# Patient Record
Sex: Female | Born: 1945 | Race: White | Hispanic: No | Marital: Single | Smoking: Never smoker
Health system: Southern US, Community
[De-identification: ages and names within clinical notes are randomized; demographics above are authoritative.]

## PROBLEM LIST (undated history)

## (undated) DIAGNOSIS — I10 Essential (primary) hypertension: Secondary | ICD-10-CM

## (undated) DIAGNOSIS — R569 Unspecified convulsions: Secondary | ICD-10-CM

---

## 2012-01-22 ENCOUNTER — Emergency Department (HOSPITAL_COMMUNITY): Payer: Medicare Other

## 2012-01-22 ENCOUNTER — Other Ambulatory Visit: Payer: Self-pay

## 2012-01-22 ENCOUNTER — Encounter (HOSPITAL_COMMUNITY): Payer: Self-pay | Admitting: Emergency Medicine

## 2012-01-22 ENCOUNTER — Emergency Department (HOSPITAL_COMMUNITY)
Admission: EM | Admit: 2012-01-22 | Discharge: 2012-01-22 | Disposition: A | Discharge status: Home / Self Care | Payer: Medicare Other | Attending: Emergency Medicine | Admitting: Emergency Medicine

## 2012-01-22 DIAGNOSIS — I10 Essential (primary) hypertension: Secondary | ICD-10-CM | POA: Insufficient documentation

## 2012-01-22 DIAGNOSIS — Z79899 Other long term (current) drug therapy: Secondary | ICD-10-CM | POA: Insufficient documentation

## 2012-01-22 DIAGNOSIS — R079 Chest pain, unspecified: Secondary | ICD-10-CM | POA: Insufficient documentation

## 2012-01-22 HISTORY — DX: Unspecified convulsions: R56.9

## 2012-01-22 HISTORY — DX: Essential (primary) hypertension: I10

## 2012-01-22 LAB — POCT I-STAT, CHEM 8
Calcium, Ion: 1.23 mmol/L (ref 1.12–1.32)
Chloride: 113 mEq/L — ABNORMAL HIGH (ref 96–112)
Creatinine, Ser: 0.8 mg/dL (ref 0.50–1.10)
Glucose, Bld: 106 mg/dL — ABNORMAL HIGH (ref 70–99)
HCT: 44 % (ref 36.0–46.0)
Potassium: 3.7 mEq/L (ref 3.5–5.1)

## 2012-01-22 LAB — CBC
HCT: 42.6 % (ref 36.0–46.0)
Hemoglobin: 14.4 g/dL (ref 12.0–15.0)
MCHC: 33.8 g/dL (ref 30.0–36.0)
MCV: 93.4 fL (ref 78.0–100.0)
RDW: 13.2 % (ref 11.5–15.5)
WBC: 9 10*3/uL (ref 4.0–10.5)

## 2012-01-22 LAB — DIFFERENTIAL
Basophils Absolute: 0 10*3/uL (ref 0.0–0.1)
Eosinophils Relative: 4 % (ref 0–5)
Lymphocytes Relative: 28 % (ref 12–46)
Monocytes Absolute: 0.6 10*3/uL (ref 0.1–1.0)
Monocytes Relative: 7 % (ref 3–12)
Neutro Abs: 5.5 10*3/uL (ref 1.7–7.7)

## 2012-01-22 NOTE — ED Notes (Signed)
Pt pain free. Remains sr on the monitor. Family at bs. Skin warm and  Dry,resp even and unlabored.

## 2012-01-22 NOTE — ED Notes (Signed)
Pt with intermittent chest pain for the last 3 months. Today worse than normal. Pain 6/10. Upon EMS arrival, denied pain. 20g L hand.

## 2012-01-22 NOTE — ED Provider Notes (Signed)
History     CSN: [id number]  Arrival date & time 01/22/12  1531   First MD Initiated Contact with Patient 01/22/12 1537      Chief Complaint  Patient presents with  . Chest Pain    (Consider location/radiation/quality/duration/timing/severity/associated sxs/prior treatment) Patient is a 66 y.o. female presenting with chest pain. The history is provided by the patient.  Chest Pain Pertinent negatives for primary symptoms include no shortness of breath, no abdominal pain, no nausea and no vomiting.  Pertinent negatives for associated symptoms include no numbness and no weakness.    patient has had episodes of chest pain for last 3 months. The common on half. She has some 3 times a week she said. Should episode today that lasted longer and was more severe than the other ones. So his left chest pressure. This woman upper neck lower also. No dyspnea. No cough. No fevers. The pain does not come on with exertion. The patient states she's not exert herself much though. She does not smoke. She does not have an early history of cardiac disease in her family. No recent travel. No leg swelling. She is pain-free now  Past Medical History  Diagnosis Date  . Hypertension   . Seizures     History reviewed. No pertinent past surgical history.  History reviewed. No pertinent family history.  History  Substance Use Topics  . Smoking status: Not on file  . Smokeless tobacco: Not on file  . Alcohol Use:     OB History    Grav Para Term Preterm Abortions TAB SAB Ect Mult Living                  Review of Systems  Constitutional: Negative for activity change and appetite change.  HENT: Negative for neck stiffness.   Eyes: Negative for pain.  Respiratory: Negative for chest tightness and shortness of breath.   Cardiovascular: Positive for chest pain. Negative for leg swelling.  Gastrointestinal: Negative for nausea, vomiting, abdominal pain and diarrhea.  Genitourinary: Negative for flank  pain.  Musculoskeletal: Negative for back pain.  Skin: Negative for rash.  Neurological: Negative for weakness, numbness and headaches.  Psychiatric/Behavioral: Negative for behavioral problems.    Allergies  Penicillins  Home Medications   Current Outpatient Rx  Name Route Sig Dispense Refill  . ATORVASTATIN CALCIUM 80 MG PO TABS Oral Take 80 mg by mouth daily.    . DULOXETINE HCL 30 MG PO CPEP Oral Take 30 mg by mouth daily.    . MELOXICAM 15 MG PO TABS Oral Take 15 mg by mouth daily.    . SERTRALINE HCL 100 MG PO TABS Oral Take 100 mg by mouth daily.    . TOPIRAMATE 50 MG PO TABS Oral Take 50-75 mg by mouth 2 (two) times daily. 1 tablet in the morning and 1.5 tablets at bedtime.    Marland Kitchen VITAMIN D (ERGOCALCIFEROL) 50000 UNITS PO CAPS Oral Take 50,000 Units by mouth every 7 (seven) days. Saturdays.      BP 126/59  Pulse 72  Temp(Src) 97.1 F (36.2 C) (Oral)  Resp 20  SpO2 96%  Physical Exam  Nursing note and vitals reviewed. Constitutional: She is oriented to person, place, and time. She appears well-developed and well-nourished.  HENT:  Head: Normocephalic and atraumatic.  Eyes: EOM are normal. Pupils are equal, round, and reactive to light.  Neck: Normal range of motion. Neck supple.  Cardiovascular: Normal rate, regular rhythm and normal heart sounds.  No murmur heard. Pulmonary/Chest: Effort normal and breath sounds normal. No respiratory distress. She has no wheezes. She has no rales.  Abdominal: Soft. Bowel sounds are normal. She exhibits no distension. There is no tenderness. There is no rebound and no guarding.  Musculoskeletal: Normal range of motion.  Neurological: She is alert and oriented to person, place, and time. No cranial nerve deficit.  Skin: Skin is warm and dry.  Psychiatric: She has a normal mood and affect. Her speech is normal.    ED Course  Procedures (including critical care time)  Labs Reviewed  POCT I-STAT, CHEM 8 - Abnormal; Notable for the  following:    Chloride 113 (*)    Glucose, Bld 106 (*)    All other components within normal limits  CBC  DIFFERENTIAL  TROPONIN I   Dg Chest 2 View  01/22/2012  *RADIOLOGY REPORT*  Clinical Data: Chest pain.  CHEST - 2 VIEW  Comparison: None.  Findings: Trachea is midline.  Heart size normal.  Minimal scarring at the lung bases, left greater than right.  Lungs are otherwise clear.  No pleural fluid.  IMPRESSION: No acute findings.  Original Report Authenticated By: Reyes Ivan, M.D.     1. Chest pain      Date: 01/22/2012  Rate: 82  Rhythm: normal sinus rhythm  QRS Axis: normal  Intervals: normal  ST/T Wave abnormalities: normal  Conduction Disutrbances:none  Narrative Interpretation:   Old EKG Reviewed: none available    MDM  Chest pain for last 3 months. Pain on and off. EKG is normal. Enzymes are negative. The pain is not with exertion. She'll followup with her primary care Dr. Melynda Keller will call [facility] heart and vascular tomorrow      I will  Juliet Rude. Rubin Payor, MD 01/22/12 [phone]

## 2013-02-24 IMAGING — CR DG CHEST 2V
2 series · 2 of 2 positions shown · non-contrast
Comparison: None.

CLINICAL DATA: Chest pain.

CHEST - 2 VIEW

[w chest pa]
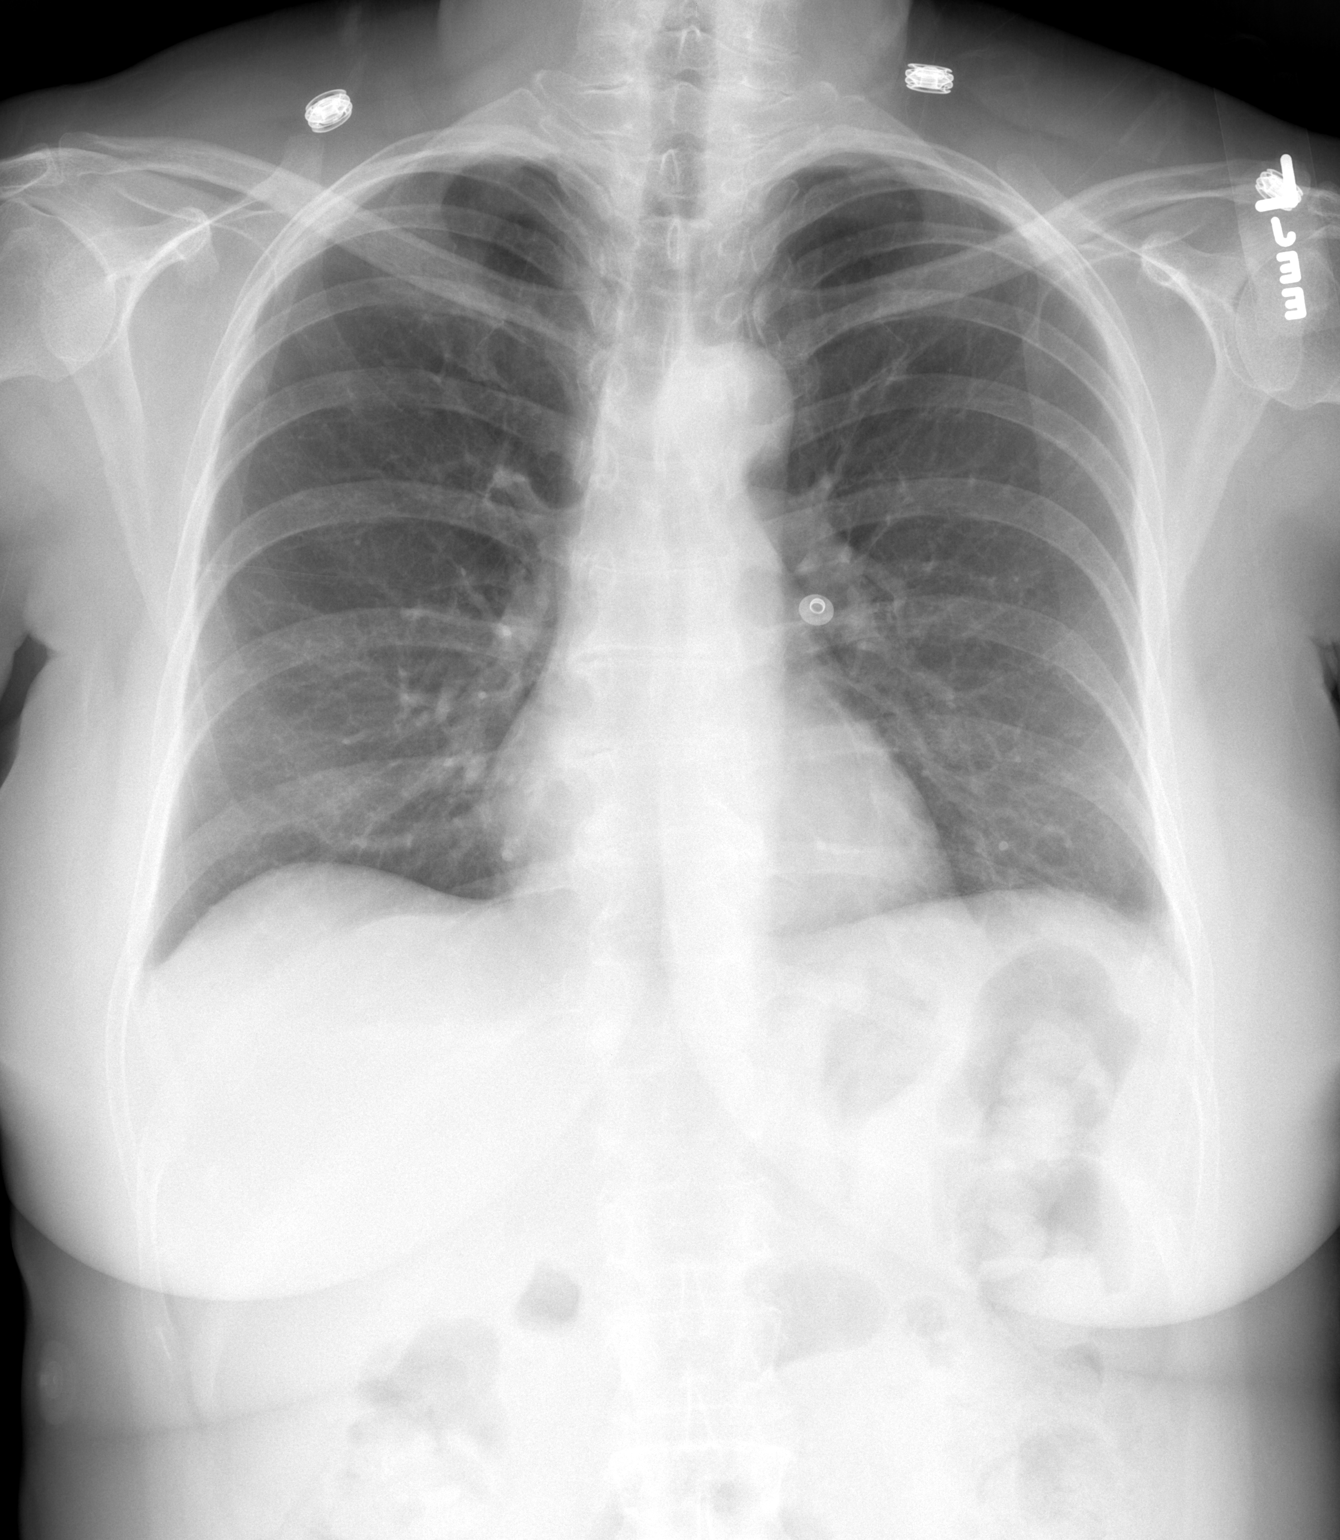

[w chest lat]
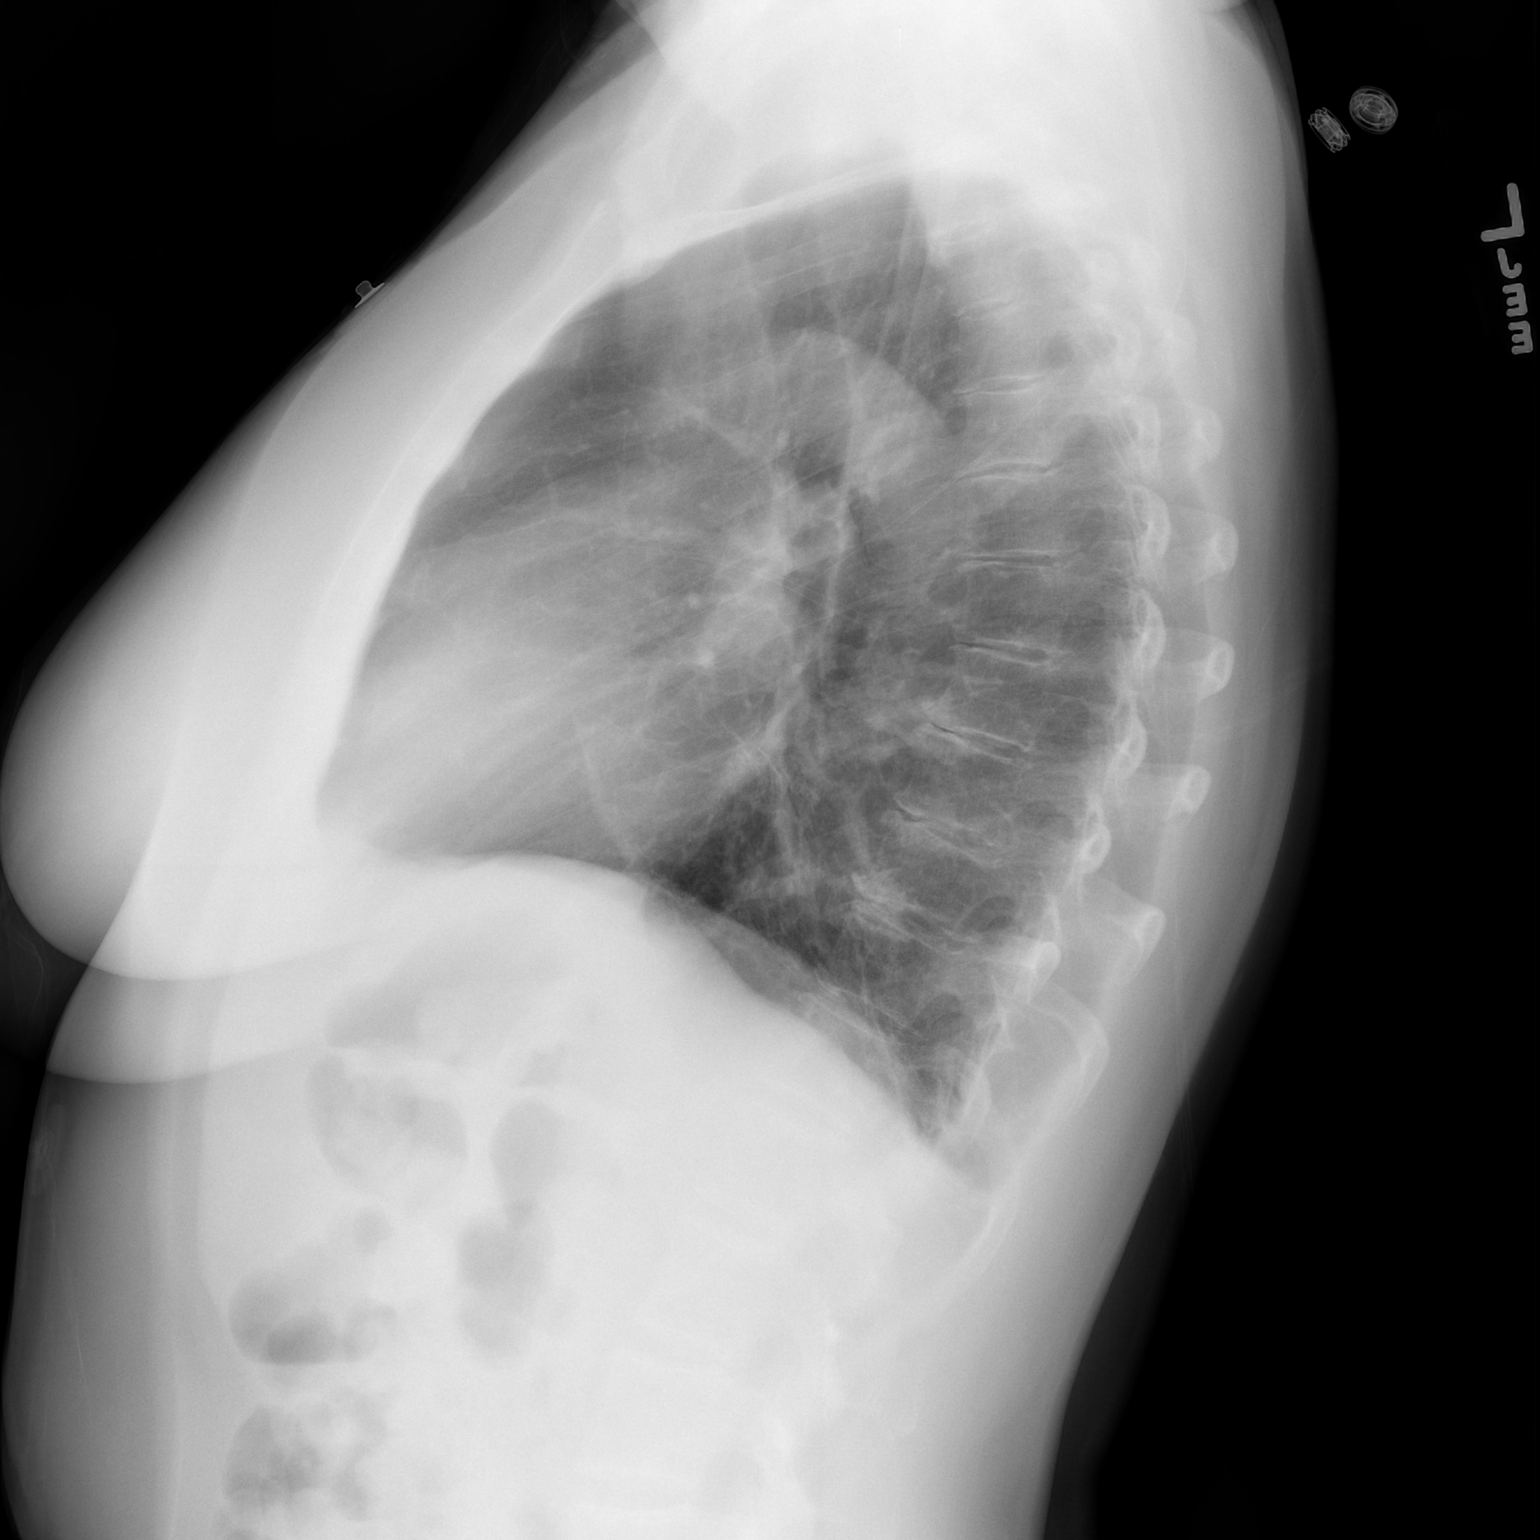

[2 of 2 positions shown; findings below may reference images not displayed]

FINDINGS: Trachea is midline.  Heart size normal.  Minimal scarring
at the lung bases, left greater than right.  Lungs are otherwise
clear.  No pleural fluid.
IMPRESSION: No acute findings.

## 2014-09-29 ENCOUNTER — Encounter (HOSPITAL_BASED_OUTPATIENT_CLINIC_OR_DEPARTMENT_OTHER): Payer: Self-pay | Admitting: Emergency Medicine

## 2014-09-29 ENCOUNTER — Emergency Department (HOSPITAL_BASED_OUTPATIENT_CLINIC_OR_DEPARTMENT_OTHER)
Admission: EM | Admit: 2014-09-29 | Discharge: 2014-09-29 | Disposition: A | Discharge status: Other Acute Inpatient Hospital | Payer: Medicare Other | Attending: Emergency Medicine | Admitting: Emergency Medicine

## 2014-09-29 ENCOUNTER — Emergency Department (HOSPITAL_BASED_OUTPATIENT_CLINIC_OR_DEPARTMENT_OTHER): Payer: Medicare Other

## 2014-09-29 DIAGNOSIS — R410 Disorientation, unspecified: Secondary | ICD-10-CM | POA: Diagnosis not present

## 2014-09-29 DIAGNOSIS — G43909 Migraine, unspecified, not intractable, without status migrainosus: Secondary | ICD-10-CM | POA: Insufficient documentation

## 2014-09-29 DIAGNOSIS — G40909 Epilepsy, unspecified, not intractable, without status epilepticus: Secondary | ICD-10-CM | POA: Insufficient documentation

## 2014-09-29 DIAGNOSIS — I10 Essential (primary) hypertension: Secondary | ICD-10-CM | POA: Diagnosis not present

## 2014-09-29 DIAGNOSIS — Z791 Long term (current) use of non-steroidal anti-inflammatories (NSAID): Secondary | ICD-10-CM | POA: Diagnosis not present

## 2014-09-29 DIAGNOSIS — R4182 Altered mental status, unspecified: Secondary | ICD-10-CM

## 2014-09-29 DIAGNOSIS — Z880 Allergy status to penicillin: Secondary | ICD-10-CM | POA: Diagnosis not present

## 2014-09-29 DIAGNOSIS — Z79899 Other long term (current) drug therapy: Secondary | ICD-10-CM | POA: Insufficient documentation

## 2014-09-29 LAB — URINALYSIS, ROUTINE W REFLEX MICROSCOPIC
BILIRUBIN URINE: NEGATIVE
GLUCOSE, UA: NEGATIVE mg/dL
Hgb urine dipstick: NEGATIVE
KETONES UR: NEGATIVE mg/dL
Nitrite: NEGATIVE
PH: 7.5 (ref 5.0–8.0)
Protein, ur: NEGATIVE mg/dL
SPECIFIC GRAVITY, URINE: 1.021 (ref 1.005–1.030)
Urobilinogen, UA: 1 mg/dL (ref 0.0–1.0)

## 2014-09-29 LAB — COMPREHENSIVE METABOLIC PANEL
ALK PHOS: 84 U/L (ref 39–117)
ALT: 30 U/L (ref 0–35)
AST: 32 U/L (ref 0–37)
Albumin: 4.3 g/dL (ref 3.5–5.2)
Anion gap: 15 (ref 5–15)
BUN: 21 mg/dL (ref 6–23)
CO2: 22 meq/L (ref 19–32)
Calcium: 9.3 mg/dL (ref 8.4–10.5)
Chloride: 106 mEq/L (ref 96–112)
Creatinine, Ser: 0.9 mg/dL (ref 0.50–1.10)
GFR, EST AFRICAN AMERICAN: 74 mL/min — AB (ref >90)
GFR, EST NON AFRICAN AMERICAN: 64 mL/min — AB (ref >90)
GLUCOSE: 191 mg/dL — AB (ref 70–99)
POTASSIUM: 4.4 meq/L (ref 3.7–5.3)
SODIUM: 143 meq/L (ref 137–147)
Total Bilirubin: 0.4 mg/dL (ref 0.3–1.2)
Total Protein: 7.8 g/dL (ref 6.0–8.3)

## 2014-09-29 LAB — CBC WITH DIFFERENTIAL/PLATELET
Basophils Absolute: 0 10*3/uL (ref 0.0–0.1)
Basophils Relative: 0 % (ref 0–1)
Eosinophils Absolute: 0.3 10*3/uL (ref 0.0–0.7)
Eosinophils Relative: 4 % (ref 0–5)
HCT: 46 % (ref 36.0–46.0)
Hemoglobin: 15.4 g/dL — ABNORMAL HIGH (ref 12.0–15.0)
LYMPHS ABS: 1.6 10*3/uL (ref 0.7–4.0)
LYMPHS PCT: 22 % (ref 12–46)
MCH: 32.3 pg (ref 26.0–34.0)
MCHC: 33.5 g/dL (ref 30.0–36.0)
MCV: 96.4 fL (ref 78.0–100.0)
Monocytes Absolute: 0.4 10*3/uL (ref 0.1–1.0)
Monocytes Relative: 6 % (ref 3–12)
NEUTROS ABS: 4.8 10*3/uL (ref 1.7–7.7)
NEUTROS PCT: 68 % (ref 43–77)
PLATELETS: 209 10*3/uL (ref 150–400)
RBC: 4.77 MIL/uL (ref 3.87–5.11)
RDW: 13.2 % (ref 11.5–15.5)
WBC: 7.1 10*3/uL (ref 4.0–10.5)

## 2014-09-29 LAB — CSF CELL COUNT WITH DIFFERENTIAL
COLOR CSF: 1 — AB
RBC Count, CSF: 2100 /mm3 — ABNORMAL HIGH
RBC Count, CSF: 8 /mm3 — ABNORMAL HIGH
TUBE #: 1
Tube #: 4
WBC, CSF: 0 /mm3 (ref 0–5)
WBC, CSF: 1 /mm3 (ref 0–5)

## 2014-09-29 LAB — CBG MONITORING, ED: Glucose-Capillary: 188 mg/dL — ABNORMAL HIGH (ref 70–99)

## 2014-09-29 LAB — HERPES SIMPLEX VIRUS(HSV) DNA BY PCR
HSV 1 DNA: NOT DETECTED
HSV 2 DNA: NOT DETECTED

## 2014-09-29 LAB — URINE MICROSCOPIC-ADD ON

## 2014-09-29 LAB — PROTIME-INR
INR: 1.03 (ref 0.00–1.49)
Prothrombin Time: 13.5 seconds (ref 11.6–15.2)

## 2014-09-29 LAB — PROTEIN, CSF: TOTAL PROTEIN, CSF: 66 mg/dL — AB (ref 15–45)

## 2014-09-29 LAB — GLUCOSE, CSF: Glucose, CSF: 92 mg/dL — ABNORMAL HIGH (ref 43–76)

## 2014-09-29 MED ORDER — METOCLOPRAMIDE HCL 5 MG/ML IJ SOLN
10.0000 mg | Freq: Once | INTRAMUSCULAR | Status: AC
  Administered 2014-09-29: 10 mg via INTRAVENOUS
  Filled 2014-09-29: qty 2

## 2014-09-29 MED ORDER — DIPHENHYDRAMINE HCL 50 MG/ML IJ SOLN
12.5000 mg | Freq: Once | INTRAMUSCULAR | Status: AC
  Administered 2014-09-29: 12.5 mg via INTRAVENOUS
  Filled 2014-09-29: qty 1

## 2014-09-29 MED ORDER — ACYCLOVIR SODIUM 50 MG/ML IV SOLN
INTRAVENOUS | Status: AC
  Administered 2014-09-29: 450 mg via INTRAVENOUS
  Filled 2014-09-29: qty 10

## 2014-09-29 MED ORDER — SODIUM CHLORIDE 0.9 % IV SOLN
Freq: Once | INTRAVENOUS | Status: AC
  Administered 2014-09-29: 10:00:00 via INTRAVENOUS

## 2014-09-29 MED ORDER — DEXTROSE 5 % IV SOLN: 450.0000 mg | Freq: Three times a day (TID) | INTRAVENOUS | Status: DC

## 2014-09-29 NOTE — ED Notes (Signed)
Pt ambulated to and from restroom with a quick and steady unassisted gait.

## 2014-09-29 NOTE — ED Provider Notes (Signed)
CSN: [id number]     Arrival date & time 09/29/14  0905 History   First MD Initiated Contact with Patient 09/29/14 [phone]     Chief Complaint  Patient presents with  . Altered Mental Status     (Consider location/radiation/quality/duration/timing/severity/associated sxs/prior Treatment) Patient is a [age] y.o. female presenting with altered mental status. The history is provided by the patient.  Altered Mental Status Presenting symptoms: behavior changes and confusion   Presenting symptoms: no disorientation   Severity:  Moderate Most recent episode:  Today Episode history:  Single Timing:  Constant Progression:  Worsening Chronicity:  New Context: not alcohol use, not dementia and not a recent illness   Associated symptoms: no abdominal pain, no fever and no vomiting     Past Medical History  Diagnosis Date  . Hypertension   . Seizures    History reviewed. No pertinent past surgical history. No family history on file. History  Substance Use Topics  . Smoking status: Never Smoker   . Smokeless tobacco: Not on file  . Alcohol Use: No   OB History   Grav Para Term Preterm Abortions TAB SAB Ect Mult Living                 Review of Systems  Constitutional: Negative for fever.  Respiratory: Negative for cough and shortness of breath.   Gastrointestinal: Negative for vomiting and abdominal pain.  Psychiatric/Behavioral: Positive for confusion.  All other systems reviewed and are negative.     Allergies  Penicillins and Dilantin  Home Medications   Prior to Admission medications   Medication Sig Start Date End Date Taking? Authorizing Provider  atorvastatin (LIPITOR) 80 MG tablet Take 80 mg by mouth daily.    Historical Provider, MD  DULoxetine (CYMBALTA) 30 MG capsule Take 30 mg by mouth daily.    Historical Provider, MD  meloxicam (MOBIC) 15 MG tablet Take 15 mg by mouth daily.    Historical Provider, MD  sertraline (ZOLOFT) 100 MG tablet Take 100 mg by mouth  daily.    Historical Provider, MD  topiramate (TOPAMAX) 50 MG tablet Take 50-75 mg by mouth 2 (two) times daily. 1 tablet in the morning and 1.5 tablets at bedtime.    Historical Provider, MD  Vitamin D, Ergocalciferol, (DRISDOL) 50000 UNITS CAPS Take 50,000 Units by mouth every 7 (seven) days. Saturdays.    Historical Provider, MD   BP 173/69  Pulse 82  Temp(Src) 98.2 F (36.8 C) (Oral)  Resp 16  Ht 4\' 11"  (1.499 m)  Wt 135 lb (61.236 kg)  BMI 27.25 kg/m2  SpO2 100% Physical Exam  Nursing note and vitals reviewed. Constitutional: She is oriented to person, place, and time. She appears well-developed and well-nourished. No distress.  HENT:  Head: Normocephalic and atraumatic.  Mouth/Throat: Oropharynx is clear and moist.  Eyes: EOM are normal. Pupils are equal, round, and reactive to light.  Neck: Normal range of motion. Neck supple.  Cardiovascular: Normal rate and regular rhythm.  Exam reveals no friction rub.   No murmur heard. Pulmonary/Chest: Effort normal and breath sounds normal. No respiratory distress. She has no wheezes. She has no rales.  Abdominal: Soft. She exhibits no distension. There is no tenderness. There is no rebound.  Musculoskeletal: Normal range of motion. She exhibits no edema.  Neurological: She is alert and oriented to person, place, and time. No cranial nerve deficit. She exhibits normal muscle tone. Coordination normal.  Answers questions slowly.   Skin: No rash noted.  She is not diaphoretic.    ED Course  LUMBAR PUNCTURE Date/Time: 09/29/2014 12:21 PM Performed by: Elwin Mocha Authorized by: Elwin Mocha Consent: Verbal consent obtained. Time out: Immediately prior to procedure a "time out" was called to verify the correct patient, procedure, equipment, support staff and site/side marked as required. Indications: evaluation for altered mental status Anesthesia: local infiltration Local anesthetic: lidocaine 1% without epinephrine Anesthetic  total: 5 ml Patient sedated: no Preparation: Patient was prepped and draped in the usual sterile fashion. Lumbar space: L4-L5 interspace Patient's position: sitting Needle gauge: 22 Needle type: spinal needle - Quincke tip Needle length: 3.5 in Number of attempts: 3 Fluid appearance: blood-tinged then clearing Tubes of fluid: 4 Total volume: 4 ml Post-procedure: site cleaned Patient tolerance: Patient tolerated the procedure well with no immediate complications.   (including critical care time) Labs Review Labs Reviewed  CBC WITH DIFFERENTIAL - Abnormal; Notable for the following:    Hemoglobin 15.4 (*)    All other components within normal limits  COMPREHENSIVE METABOLIC PANEL  PROTIME-INR  URINALYSIS, ROUTINE W REFLEX MICROSCOPIC  CBG MONITORING, ED    Imaging Review Dg Chest 2 View  09/29/2014   CLINICAL DATA:  Altered mental status, headache  EXAM: CHEST  2 VIEW  COMPARISON:  01/22/2012  FINDINGS: The heart size and mediastinal contours are within normal limits. Both lungs are clear. The visualized skeletal structures are unremarkable.  IMPRESSION: No active cardiopulmonary disease.   Electronically Signed   By: Elige Ko   On: 09/29/2014 10:08   Ct Head Wo Contrast  09/29/2014   CLINICAL DATA:  Headache since this morning, denies any injury  EXAM: CT HEAD WITHOUT CONTRAST  TECHNIQUE: Contiguous axial images were obtained from the base of the skull through the vertex without intravenous contrast.  COMPARISON:  None.  FINDINGS: There is no evidence of mass effect, midline shift or extra-axial fluid collections. There is no evidence of a space-occupying lesion or intracranial hemorrhage. There is no evidence of a cortical-based area of acute infarction.  The ventricles and sulci are appropriate for the patient's age. The basal cisterns are patent.  Visualized portions of the orbits are unremarkable. The visualized portions of the paranasal sinuses and mastoid air cells are  unremarkable.  The osseous structures are unremarkable.  IMPRESSION: No acute intracranial pathology.   Electronically Signed   By: Elige Ko   On: 09/29/2014 09:46     EKG Interpretation None      MDM   Final diagnoses:  Disorientation  Altered mental status, unspecified altered mental status type    25F here with AMS. Began this morning. Awoke with a headache 2.5 hours ago, severe, not like her prior migraines. Daughter also notes confusion with repetition of multiple phrases. Here vitals show hypertension, otherwise stable. Nonfocal neuro exam, oriented to year and place. Patient seems "spacey" and is answering questions slowly. Will scan head to look for SAH, within 6 hours of HA onset. Will also check labs. Daughter states this is how she has acted post-ictal previously. Hasn't had a seizure in roughly 30 years that she knows of. Is on topamax at this time for seizures. If this is a seizure, still post-ictal with her confusion, which would mean about 2 hours of post-ictal time. Still confused on re-exam. CT Head normal, labs normal. Admitted by Dr. Marisa Severin at [facility] performed per his request. Acyclovir started.  I have reviewed all labs and imaging and considered them in my medical decision making.  Elwin MochaBlair Jabril Pursell, MD 09/29/14 1224

## 2014-09-29 NOTE — ED Notes (Signed)
MD at bedside performing LP.

## 2014-09-29 NOTE — ED Notes (Signed)
Patient transported to and from xray department.

## 2014-09-29 NOTE — ED Notes (Signed)
Pt sts she awoke this am with a HA.

## 2014-09-29 NOTE — ED Notes (Signed)
Megan at [facility] said Dr. Janean SarkJoslyn is on call for unassign.  Call will be returned to [phone]

## 2014-09-29 NOTE — ED Notes (Signed)
Patient transported to CT 

## 2014-09-29 NOTE — ED Notes (Signed)
IV charted as removed but pt transport to [facility] with saline lock in place.

## 2014-09-29 NOTE — ED Notes (Signed)
[facility] Dr[facility] Gwendolyn GrantWalden advised ok to transport pt off of cardiac monitor.

## 2014-09-29 NOTE — ED Notes (Signed)
MD at bedside. 

## 2014-10-02 LAB — CSF CULTURE W GRAM STAIN: Special Requests: NORMAL

## 2014-10-02 LAB — CSF CULTURE: CULTURE: NO GROWTH

## 2015-11-02 IMAGING — CT CT HEAD W/O CM
1 series · 16 of 30 positions shown, 20 images · non-contrast
Comparison: None.

CLINICAL DATA: Headache since this morning, denies any injury

EXAM:
CT HEAD WITHOUT CONTRAST
TECHNIQUE: Contiguous axial images were obtained from the base of the skull
through the vertex without intravenous contrast.

[Series 2: head 4.8 h37s · axial · 0.47mm/px · z∈[-100,+40]mm · 16 of 32 slices shown, 20 images]
[im 2/32  brain]
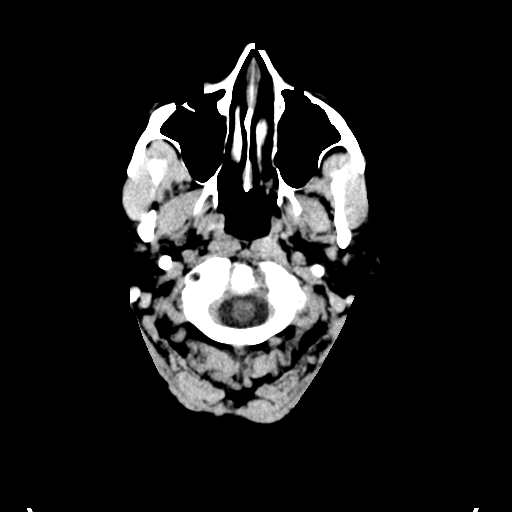
[im 2/32  bone]
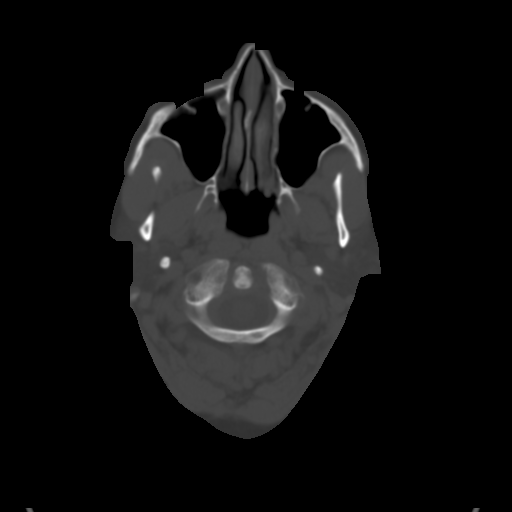
[im 4/32  brain]
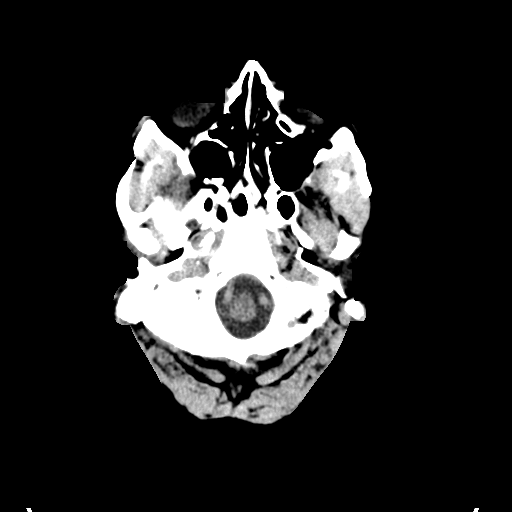
[im 6/32  brain]
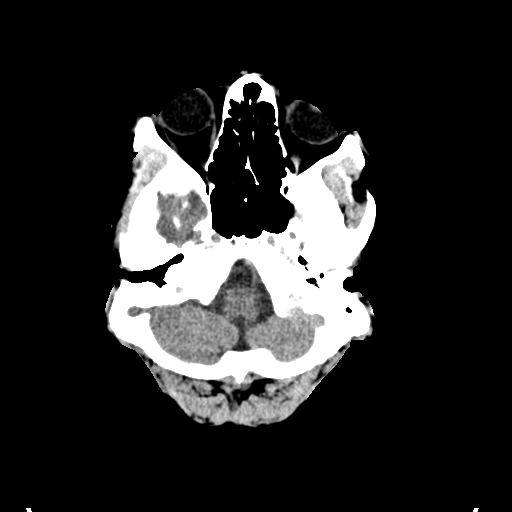
[im 8/32  brain]
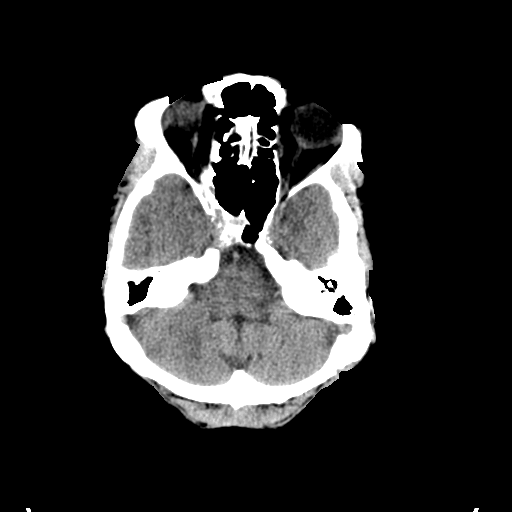
[im 9/32  brain]
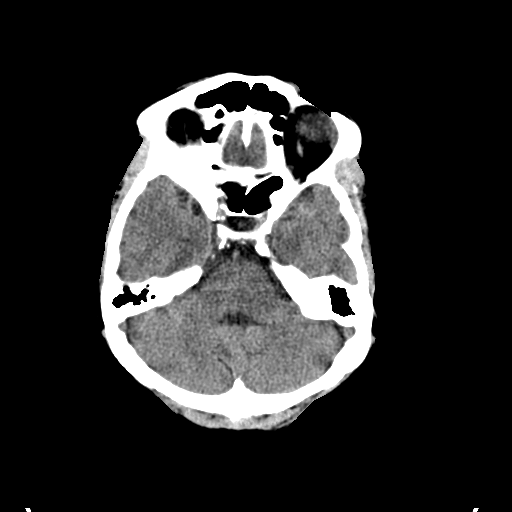
[im 9/32  bone]
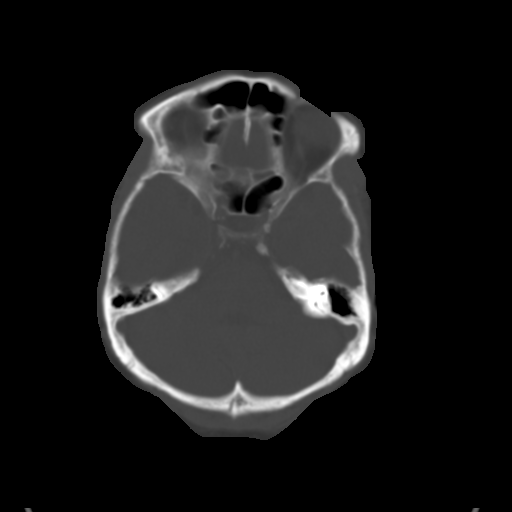
[im 11/32  brain]
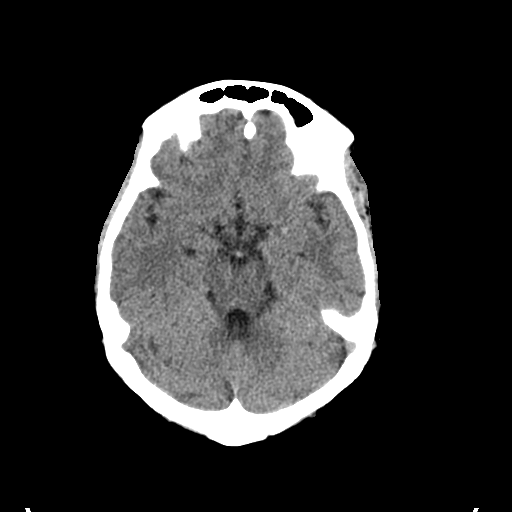
[im 13/32  brain]
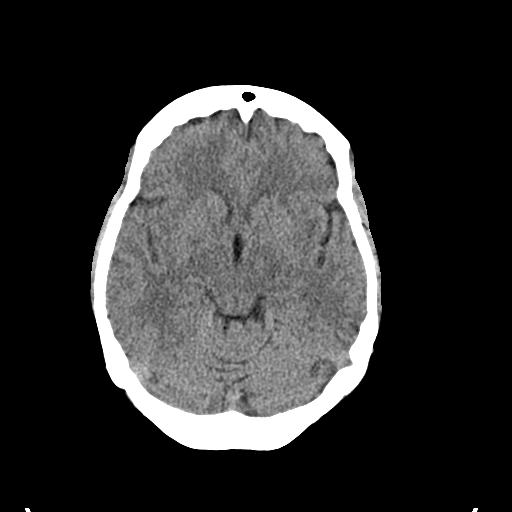
[im 15/32  brain]
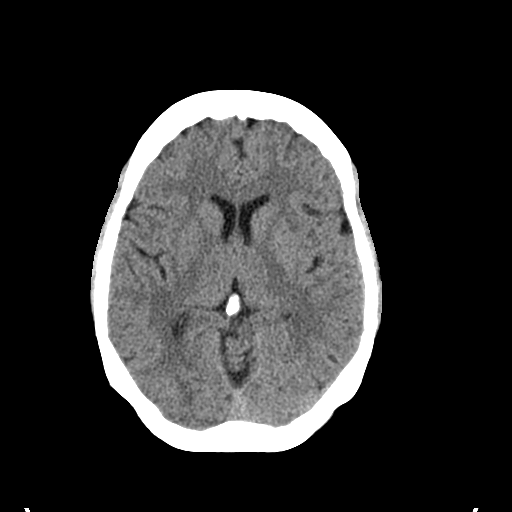
[im 17/32  brain]
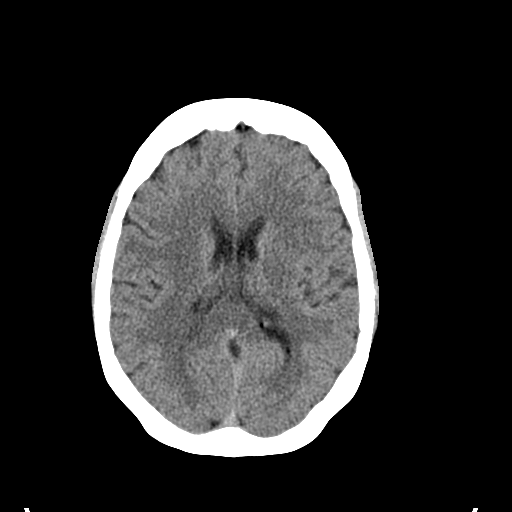
[im 17/32  bone]
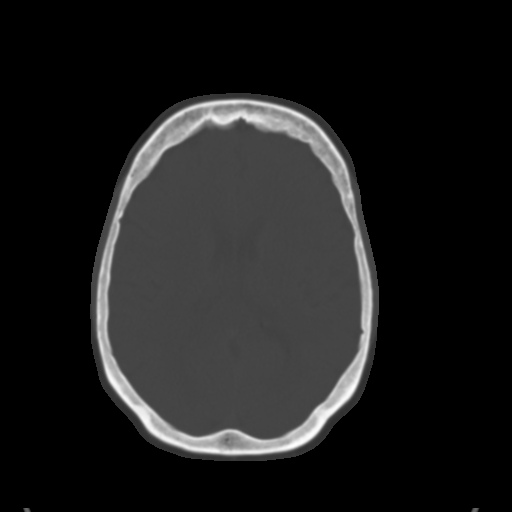
[im 19/32  brain]
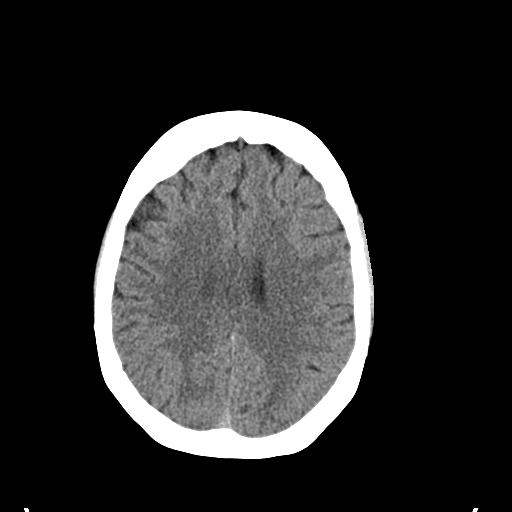
[im 21/32  brain]
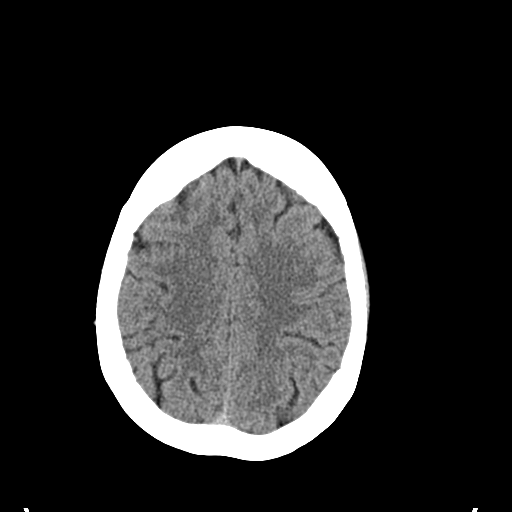
[im 23/32  brain]
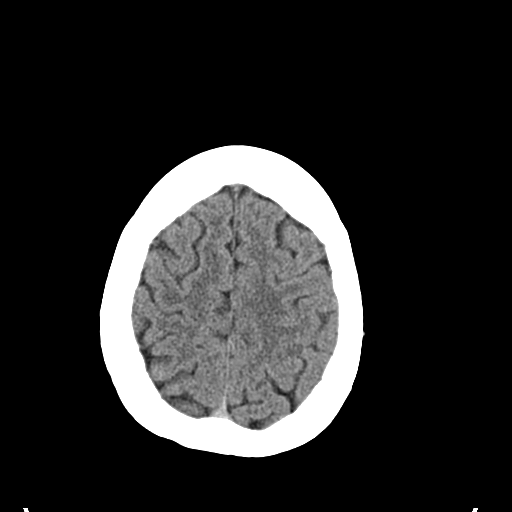
[im 24/32  brain]
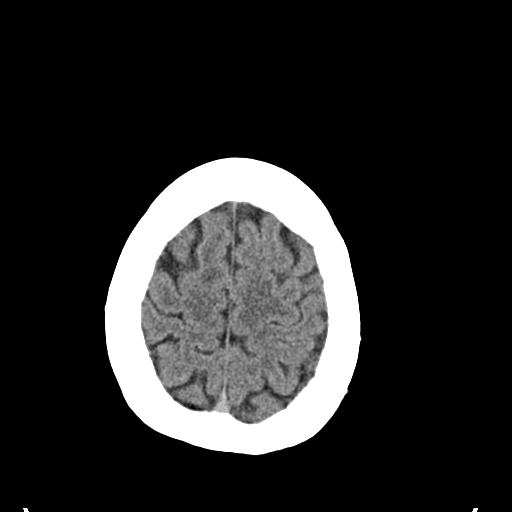
[im 24/32  bone]
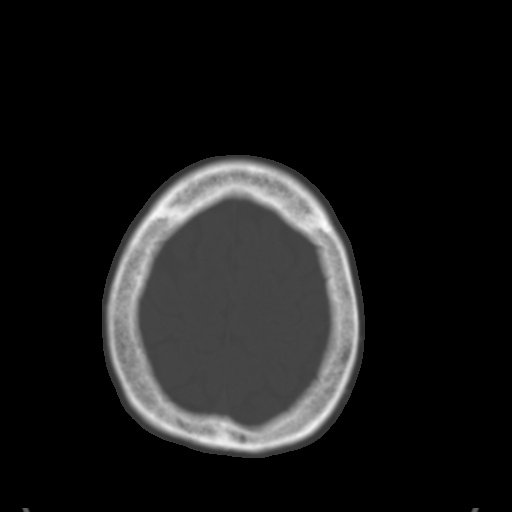
[im 26/32  brain]
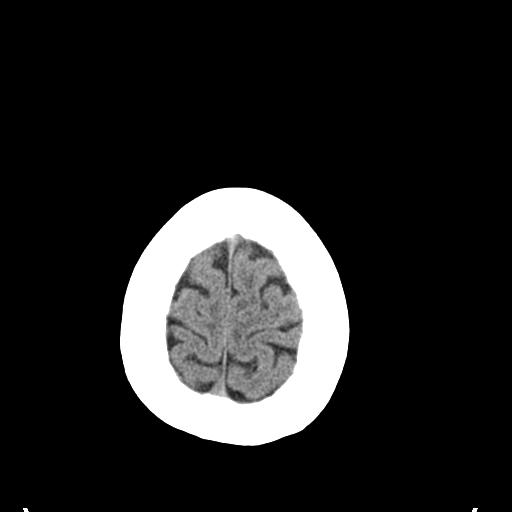
[im 28/32  brain]
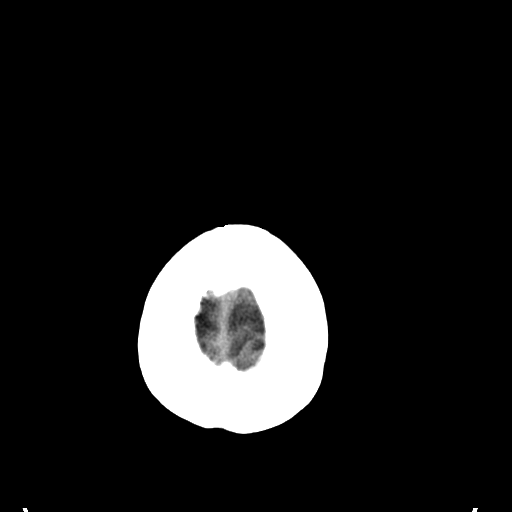
[im 30/32  brain]
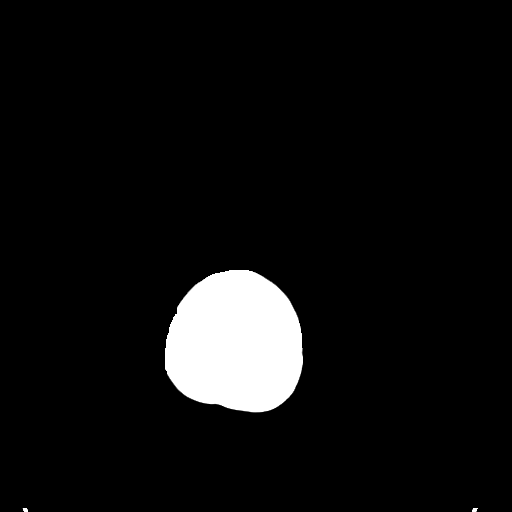

[16 of 30 positions shown; findings below may reference images not displayed]

FINDINGS: There is no evidence of mass effect, midline shift or extra-axial
fluid collections. There is no evidence of a space-occupying lesion
or intracranial hemorrhage. There is no evidence of a cortical-based
area of acute infarction.

The ventricles and sulci are appropriate for the patient's age. The
basal cisterns are patent.

Visualized portions of the orbits are unremarkable. The visualized
portions of the paranasal sinuses and mastoid air cells are
unremarkable.

The osseous structures are unremarkable.
IMPRESSION: No acute intracranial pathology.

## 2016-07-22 DEATH — deceased
